# Patient Record
Sex: Male | Born: 2000 | Race: White | Marital: Single | Smoking: Never smoker
Health system: Southern US, Community
[De-identification: ages and names within clinical notes are randomized; demographics above are authoritative.]

## PROBLEM LIST (undated history)

## (undated) DIAGNOSIS — F988 Other specified behavioral and emotional disorders with onset usually occurring in childhood and adolescence: Secondary | ICD-10-CM

---
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
## 2021-05-18 ENCOUNTER — Emergency Department (HOSPITAL_BASED_OUTPATIENT_CLINIC_OR_DEPARTMENT_OTHER)
Admission: EM | Admit: 2021-05-18 | Discharge: 2021-05-18 | Disposition: A | Discharge status: Home / Self Care | Payer: BC Managed Care – PPO | Attending: Emergency Medicine | Admitting: Emergency Medicine

## 2021-05-18 ENCOUNTER — Encounter (HOSPITAL_BASED_OUTPATIENT_CLINIC_OR_DEPARTMENT_OTHER): Payer: Self-pay | Admitting: *Deleted

## 2021-05-18 ENCOUNTER — Emergency Department (HOSPITAL_BASED_OUTPATIENT_CLINIC_OR_DEPARTMENT_OTHER): Payer: BC Managed Care – PPO

## 2021-05-18 ENCOUNTER — Other Ambulatory Visit: Payer: Self-pay

## 2021-05-18 DIAGNOSIS — M25561 Pain in right knee: Secondary | ICD-10-CM | POA: Diagnosis not present

## 2021-05-18 DIAGNOSIS — S8991XA Unspecified injury of right lower leg, initial encounter: Secondary | ICD-10-CM | POA: Insufficient documentation

## 2021-05-18 DIAGNOSIS — Y92410 Unspecified street and highway as the place of occurrence of the external cause: Secondary | ICD-10-CM | POA: Diagnosis not present

## 2021-05-18 HISTORY — DX: Other specified behavioral and emotional disorders with onset usually occurring in childhood and adolescence: F98.8

## 2021-05-18 NOTE — Discharge Instructions (Signed)
Overall suspect that you have a right knee bruise.  Recommend Tylenol, ice, ibuprofen.  Follow-up with your primary care doctor if pain is persistent.  Recommend wearing compression sleeve or Ace wrap with some compression to it. ?
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
